# Patient Record
Sex: Female | Born: 1970 | Race: White | Hispanic: No | Marital: Married
Health system: Southern US, Community
[De-identification: ages and names within clinical notes are randomized; demographics above are authoritative.]

---

## 1998-04-01 ENCOUNTER — Other Ambulatory Visit: Admission: RE | Admit: 1998-04-01 | Discharge: 1998-04-01 | Payer: Self-pay | Admitting: Gynecology

## 1999-04-21 ENCOUNTER — Other Ambulatory Visit: Admission: RE | Admit: 1999-04-21 | Discharge: 1999-04-21 | Payer: Self-pay | Admitting: Gynecology

## 2000-03-22 ENCOUNTER — Other Ambulatory Visit: Admission: RE | Admit: 2000-03-22 | Discharge: 2000-03-22 | Payer: Self-pay | Admitting: Obstetrics and Gynecology

## 2000-10-06 ENCOUNTER — Encounter (INDEPENDENT_AMBULATORY_CARE_PROVIDER_SITE_OTHER): Payer: Self-pay

## 2000-10-06 ENCOUNTER — Inpatient Hospital Stay (HOSPITAL_COMMUNITY): Admission: AD | Admit: 2000-10-06 | Discharge: 2000-10-09 | Payer: Self-pay | Admitting: Obstetrics and Gynecology

## 2000-10-13 ENCOUNTER — Encounter: Admission: RE | Admit: 2000-10-13 | Discharge: 2001-01-11 | Payer: Self-pay | Admitting: Obstetrics and Gynecology

## 2000-11-04 ENCOUNTER — Other Ambulatory Visit: Admission: RE | Admit: 2000-11-04 | Discharge: 2000-11-04 | Payer: Self-pay | Admitting: Obstetrics and Gynecology

## 2001-09-07 ENCOUNTER — Ambulatory Visit (HOSPITAL_COMMUNITY): Admission: RE | Admit: 2001-09-07 | Discharge: 2001-09-07 | Payer: Self-pay | Admitting: Obstetrics and Gynecology

## 2001-12-02 ENCOUNTER — Inpatient Hospital Stay (HOSPITAL_COMMUNITY): Admission: AD | Admit: 2001-12-02 | Discharge: 2001-12-05 | Payer: Self-pay | Admitting: Obstetrics and Gynecology

## 2001-12-16 ENCOUNTER — Encounter: Admission: RE | Admit: 2001-12-16 | Discharge: 2002-01-15 | Payer: Self-pay | Admitting: Obstetrics and Gynecology

## 2002-01-11 ENCOUNTER — Other Ambulatory Visit: Admission: RE | Admit: 2002-01-11 | Discharge: 2002-01-11 | Payer: Self-pay | Admitting: Obstetrics and Gynecology

## 2003-02-23 ENCOUNTER — Other Ambulatory Visit: Admission: RE | Admit: 2003-02-23 | Discharge: 2003-02-23 | Payer: Self-pay | Admitting: Obstetrics and Gynecology

## 2003-05-22 ENCOUNTER — Other Ambulatory Visit: Admission: RE | Admit: 2003-05-22 | Discharge: 2003-05-22 | Payer: Self-pay | Admitting: Obstetrics and Gynecology

## 2003-10-24 ENCOUNTER — Encounter: Admission: RE | Admit: 2003-10-24 | Discharge: 2003-10-24 | Payer: Self-pay | Admitting: Obstetrics and Gynecology

## 2003-10-31 ENCOUNTER — Encounter: Admission: RE | Admit: 2003-10-31 | Discharge: 2003-10-31 | Payer: Self-pay | Admitting: Obstetrics and Gynecology

## 2003-11-06 ENCOUNTER — Encounter: Admission: RE | Admit: 2003-11-06 | Discharge: 2003-11-06 | Payer: Self-pay | Admitting: Obstetrics and Gynecology

## 2003-11-14 ENCOUNTER — Encounter: Admission: RE | Admit: 2003-11-14 | Discharge: 2003-11-14 | Payer: Self-pay | Admitting: Obstetrics and Gynecology

## 2003-11-17 ENCOUNTER — Inpatient Hospital Stay (HOSPITAL_COMMUNITY): Admission: AD | Admit: 2003-11-17 | Discharge: 2003-11-17 | Payer: Self-pay | Admitting: Obstetrics and Gynecology

## 2003-11-20 ENCOUNTER — Encounter: Admission: RE | Admit: 2003-11-20 | Discharge: 2003-11-20 | Payer: Self-pay | Admitting: Obstetrics and Gynecology

## 2003-11-23 ENCOUNTER — Encounter (INDEPENDENT_AMBULATORY_CARE_PROVIDER_SITE_OTHER): Payer: Self-pay | Admitting: Specialist

## 2003-11-23 ENCOUNTER — Inpatient Hospital Stay (HOSPITAL_COMMUNITY): Admission: AD | Admit: 2003-11-23 | Discharge: 2003-11-27 | Payer: Self-pay | Admitting: Obstetrics and Gynecology

## 2004-01-29 ENCOUNTER — Other Ambulatory Visit: Admission: RE | Admit: 2004-01-29 | Discharge: 2004-01-29 | Payer: Self-pay | Admitting: Obstetrics and Gynecology

## 2005-02-16 ENCOUNTER — Other Ambulatory Visit: Admission: RE | Admit: 2005-02-16 | Discharge: 2005-02-16 | Payer: Self-pay | Admitting: Obstetrics and Gynecology

## 2008-08-20 ENCOUNTER — Encounter: Admission: RE | Admit: 2008-08-20 | Discharge: 2008-08-20 | Payer: Self-pay | Admitting: Obstetrics and Gynecology

## 2011-02-27 NOTE — Discharge Summary (Signed)
NAME:  Jacqueline Hubbard, Jacqueline Hubbard                        ACCOUNT NO.:  [id number]   MEDICAL RECORD NO.:  [id number]                   PATIENT TYPE:  INP   LOCATION:  9132                                 FACILITY:  [facility]   PHYSICIAN:  Tracie Harrier, M.D.              DATE OF BIRTH:  Jan 17, 1971   DATE OF ADMISSION:  11/23/2003  DATE OF DISCHARGE:  11/27/2003                                 DISCHARGE SUMMARY   ADMISSION DIAGNOSES:  1. Intrauterine pregnancy at 36-5/7 weeks estimated gestational age.  2. Previous cesarean section, desires repeat.  3. Twin gestation.  4. Pregnancy induced hypertension.   DISCHARGE DIAGNOSES:  1. Status post low transverse cesarean section.  2. Viable twin female infants.   PROCEDURE:  Repeat low transverse cesarean section.   REASON FOR ADMISSION:  Please see written history and physical.   HISTORY OF PRESENT ILLNESS:  The patient was a 40 year old white married  female, gravida 3, para 2, that was admitted to [facility]  at 36-6/7 weeks estimated gestational age for cesarean section. The patient  had been followed closely in the office with noted increasing ability of her  blood pressure's. The patient was also noted to have proteinuria and also  had noted that CBC had revealed platelet count, which was declining. On the  morning of admission, blood pressure was 150/80's to 90's. Platelet's were  back into the 140's and other labs were within normal limits. The babies  were reactive. The patient was then transferred to the operating room where  spinal anesthesia was administered without difficulty. Low transverse  incision was made with the delivery of twin A female infant, weighing 8 pounds  9 ounces with Apgar's of 9 at 1 minute and 9 at 5 minutes. Umbilical cord pH  was 7.07. Baby B was delivered also a viable female infant weighing 6 pounds 6  ounces with Apgar's of 9 at 1 minute and 9 at 5 minutes. Umbilical cord pH  was 7.081. The patient  tolerated the procedure well and was taken to the  recovery room in stable condition. The patient was then transferred to the  AICU where magnesium sulfate was administered for 24 hours. On the following  day, the patient was feeling well. Blood pressure's 130's to 140's over 70  to 80's. Abdomen was soft with good return of bowel function. Fundus firm,  nontender. Labs revealed platelet count 119,000. LDH was 326. Otherwise  other labs were normal. On postoperative day 2, patient was without  complaints. Vital signs were stable. Blood pressure 120's to 130's over 60  to 70's. Urine output was increased. Magnesium sulfate was continuing.  Abdomen was soft. Dressing had been removed which revealed an incision that  was clean, dry and intact. Labs revealed hemoglobin of 8.1. AST was 42.  Magnesium sulfate was discontinued and the patient was later transferred to  the mother baby unit. On postoperative day  3, the patient was without  complaint. Vital signs were stable. She was afebrile. Blood pressures 120 to  140's over 70's to 89. Deep tendon reflexes were 1+. Abdomen was slightly  distended. Fundus was firm and non-tender. Incision was clean, dry, and  intact. She was ambulating well. Labs revealed hemoglobin of 7.1, platelet  count of 209,000. Liver function studies were within normal limits with the  exception of LDH, which was 304. On postoperative day 4, the patient was  without complaint. Vital signs were stable. Abdomen was soft. Fundus was  firm. Incision was clean, dry, and intact with slight drainage  noted at the  right margin of the incision. Ecchymosis was also noted. The patient was  ambulating well, tolerating a regular diet without complaints of nausea and  vomiting. Discharge teaching was reviewed and the patient was discharged  home.   CONDITION ON DISCHARGE:  Good.   DIET:  Regular as tolerated.   ACTIVITY:  No heavy lifting. No driving x2 weeks. No vaginal entry.    FOLLOW UP:  The patient to followup in the office in 2 days for an incision  check. She is to call for temperature greater than 100 degrees, persistent  nausea and vomiting, heavy vaginal bleeding, and/or redness or drainage from  the incisional site.   DISCHARGE MEDICATIONS:  1. Percocet 5/325 #30 1 p.o. every 4 to 6 hour p.r.n.  2. Motrin 600 mg every 6 hours.  3. Prenatal vitamins 1 p.o. daily.  4. Colace 1 p.o. daily p.r.n.     Julio Sicks, N.P.                        Tracie Harrier, M.D.    CC/MEDQ  D:  12/16/2003  T:  12/16/2003  Job:  [id number]

## 2011-02-27 NOTE — Discharge Summary (Signed)
[facility]  Patient:    Jacqueline Hubbard, Jacqueline Hubbard                       MRN: [id number] Adm. Date:  [id number] Disc. Date: [id number] Attending:  Trevor Iha Dictator:   Danie Chandler, R.N.                           Discharge Summary  ADMITTING DIAGNOSIS:          Intrauterine pregnancy at term in active labor.  DISCHARGE DIAGNOSES:          Intrauterine pregnancy at term in active labor, failure to descend, meconium stained fluid, chorioamnionitis.  PROCEDURE:                    On October 06, 2000 primary low transverse cesarean section.  REASON FOR ADMISSION:         The patient is a 40 year old married white female prima gravida who presented on the morning of December 26 with spontaneous onset of labor.  The temperature at that time was 100.3.  Later that morning artificial rupture of membranes was carried out revealing slightly meconium stained fluid.  Pitocin augmentation was begun.  She was begun on IV Cleocin due to maternal temperature.  She progressed to complete dilatation.  Despite adequate expulsive effort, the vertex remained at a 0 station despite 2 1/2 hours maternal effort.  The maternal temperature was up to 102.3 at that time.  Fetal heart rate was in the 180s with no decelerations.  The decision was made to proceed with primary cesarean section.  HOSPITAL COURSE:              The patient was taken to the operating room and underwent the above named procedure without complication.  This was productive of a viable female infant with Apgars of 8 at one minute and 9 at five minutes. Postoperatively on day #1 the patients hemoglobin was 12.1, hematocrit 34.3, and white blood cell count 18.1.  On postoperative day #2 the patient was without complaint.  She had a good return of bowel function and was tolerating a regular diet.  She was also ambulating well without difficulty, had good pain control.  She was discharged home on  postoperative day #3.  CONDITION ON DISCHARGE:       Good.  DIET:                         Regular, as tolerated.  ACTIVITY:                     No heavy lifting, no driving, no vaginal entry.  FOLLOW-UP:                    She is to follow-up in the office in one to two weeks for incision check.  She is to call for temperature greater than 100 degrees, persistent nausea or vomiting, heavy vaginal bleeding, and/or redness or drainage from the incision site.  DISCHARGE MEDICATIONS:        1. Percocet #30 one to two p.o. q.4-6h. p.r.n.                                  pain.  2. Prenatal vitamin one p.o. q.d. DD:  11/02/00 TD:  11/02/00 Job: [id number]

## 2011-02-27 NOTE — H&P (Signed)
[facility]  Patient:    Jacqueline Hubbard, Jacqueline Hubbard Visit Number: [id number] MRN: [id number]          Service Type: Attending:  Duke Salvia. Marcelle Overlie, M.D. Dictated by:   Duke Salvia. Marcelle Overlie, M.D. Adm. Date:  12/02/01                           History and Physical  CHIEF COMPLAINT:              For repeat cesarean section.  HISTORY OF PRESENT ILLNESS:   This 40 year old, G2, P1-0-0-1, underwent LTCS in December of 2001 for CPD and chorioamnionitis.  She presents now for repeat cesarean section.  She has declined VBAC.  This procedure, including the risks of bleeding, infection, transfusion, adjacent organ injury, and expected recovery time, were all reviewed with her.  Her blood type was AB-.  Rubella titer positive.  A one-hour GTT was 81.  The group B streptococcus screen was negative.  ALLERGIES:                    PENICILLIN.  PAST SURGICAL HISTORY:        Cesarean section in December of 2001.  REVIEW OF SYSTEMS:            Otherwise unremarkable.  PHYSICAL EXAMINATION:         Temperature 98.2 degrees, blood pressure 110/70. HEENT:                        Unremarkable.  NECK:                         Supple without mass.  LUNGS:                        Clear.  CARDIOVASCULAR:               Regular rate and rhythm without murmurs, rubs, or gallops.  BREASTS:                      Not examined.  ABDOMEN:                      Term fundal height.  Fetal heart rate 140.  PELVIC:                       The cervix was closed.  EXTREMITIES:                  Unremarkable.  NEUROLOGIC:                   Unremarkable.  IMPRESSION:                   Term intrauterine pregnancy.  The estimated date of delivery is December 06, 2001, by ultrasound.  PLAN:                         Repeat cesarean section.  The procedure and risks were discussed as above. Dictated by:   Duke Salvia. Marcelle Overlie, M.D. Attending:  Duke Salvia. Marcelle Overlie, M.D. DD:  11/29/01 TD:   11/29/01 Job: [id number]

## 2011-02-27 NOTE — Op Note (Signed)
NAME:  Jacqueline Hubbard, Jacqueline Hubbard                        ACCOUNT NO.:  [id number]   MEDICAL RECORD NO.:  [id number]                   PATIENT TYPE:  INP   LOCATION:  9372                                 FACILITY:  [facility]   PHYSICIAN:  Guy Sandifer. Arleta Creek, M.D.           DATE OF BIRTH:  July 20, 1971   DATE OF PROCEDURE:  11/23/2003  DATE OF DISCHARGE:                                 OPERATIVE REPORT   PREOPERATIVE DIAGNOSES:  1. Intrauterine pregnancy at 36-6/7 weeks estimated gestational age.  2. Twins.  3. Previous cesarean section, desires repeat.  4. Pregnancy-induced hypertension.   POSTOPERATIVE DIAGNOSES:  1. Intrauterine pregnancy at 36-6/17 weeks estimated gestational age.  2. Twins.  3. Previous cesarean section.  4. Pregnancy-induced hypertension.   PROCEDURE:  Repeat low transverse cesarean section.   SURGEON:  Guy Sandifer. Henderson Cloud, M.D.   ANESTHESIA:  General.   ANESTHESIOLOGIST:  Leilani Able, M.D.   ESTIMATED BLOOD LOSS:  800 cc.   SPECIMENS:  Placenta to pathology.   FINDINGS:  1. Baby A -- Viable female infant.  Apgars of 9 and 9 at one and five minutes     respectively.  8 pounds 9 ounces.  Arterial cord pH 7.07.  2. Baby B -- Viable female infant.  Apgars of 9 and 9 at one and five minutes     respectively.  6 pounds 6 ounces.  Arterial cord pH 7.081.   INDICATIONS AND CONSENT:  The patient is a 40 year old married white female  (G3, P2) with an EDC of December 16, 2003, who has been followed for increasing  lability of her blood pressures.  She has also had 1 and 2+ protein in the  urine.  A 24-hr urine approximately 1-1/2 weeks ago had  150 mg total protein.  Repeat 24-hr urine turned in yesterday date obtained  today, revealed total protein of approximately 330 mg of protein.  CBC of  yesterday also had a platelet count of 113,000, whereas it was 140,000  previously.  The patient was reevaluated today in triage, and her blood  pressures were 150/80-90.  Platelets were  back up to 140s and other labs  were within normal limits.  Babies were reactive.   After discussion of the options with the patient, the decision was made to  proceed to cesarean section.  Potential risks and complications were  discussed with the patient preoperatively.   DESCRIPTION OF PROCEDURE:  The patient was taken to the operating room,  where a spinal anesthetic is placed.  She is placed in the dorsal supine  position with a 15-degree left lateral wedge.  The abdomen is taped upward  on the bar to elevate the panniculus.  She is prepped, bladder is  catheterized with a Foley catheter, and she is draped in a sterile fashion.  After testing for adequate spinal anesthesia, skin was entered around the  previous Pfannenstiel scar, with the scar being taken out  of the way.   Dissection is carried out in layers to the peritoneum, which is incised and  extended superiorly and inferiorly.  The vesicouterine peritoneum is taken  down cephalolaterally.  The bladder flap is developed and the bladder blade  is placed.  The uterus is incised in a low transverse manner, and the  uterine cavity is entered bluntly with a hemostat.  Clear fluid is  encountered.  The incision is extended cephalolaterally with the fingers.   The first infant encountered is in the frank breech presentation, and is  delivered without difficulty.  There is a nuchal cord x1.  Good cry and tone  is noted.  Oral and nasopharynx are suctioned and the cord is clamped and  cut.  The baby is handed to awaiting pediatrics team.   Artifical rupture of membrane on the second baby is carried out for clear  fluid as well.  This baby is vertex.  It is delivered without difficulty.  There is also a nuchal cord x1 noted.  Good cry and tone are again noted  upon delivery.  The oral and nasopharynx suctioned as well.  This baby is  also handed to the pediatric team.  The umbilical cord for Baby B is marked  with a plastic cord  clamp.  Placenta is manually delivered and sent to  pathology.   The uterus has a smooth internal contour and is clean.  It is closed in a  running interlocking fashion with 0 Monocryl suture, which is used for good  hemostasis.  Tubes and ovaries are normal bilaterally.  The anterior  peritoneum is closed in running fashion with  0 Monocryl suture, which is also used reapproximate the pyramidalis muscle  in the midline.  The rectus fascia is closed in running fashion with 0 PDS  suture.  The skin is closed with clips.   All sponge, needle and instrument counts are correct.  The patient is taken  to the recovery room in stable condition.                                               Guy Sandifer Arleta Creek, M.D.    JET/MEDQ  D:  11/23/2003  T:  11/24/2003  Job:  [id number]

## 2011-02-27 NOTE — Discharge Summary (Signed)
[facility]  Patient:    Jacqueline Hubbard, Jacqueline Hubbard Visit Number: [id number] MRN: [id number]          Service Type: OBS Location: 910A 9117 01 Attending Physician:  Rhina Brackett Dictated by:   Danie Chandler, R.N. Admit Date:  12/02/2001 Discharge Date: 12/05/2001                             Discharge Summary  ADMITTING DIAGNOSES: 1. Intrauterine pregnancy at term. 2. Previous cesarean section, declines vaginal birth after cesarean section.  DISCHARGE DIAGNOSES: 1. Intrauterine pregnancy at term. 2. Previous cesarean section, declines vaginal birth after cesarean section.  PROCEDURE:                    On December 02, 2001, repeat low transverse cesarean section.  REASON FOR ADMISSION:         Please see dictated H&P.  HOSPITAL COURSE:              The patient was taken to the operating room and underwent the above-named procedure without complication.  This was productive of a viable female infant with Apgars of 9 at one minute and 9 at five minutes.  Postoperatively on day #1, the patient was without complaint, vital signs were stable, her hemoglobin was 9.0, hematocrit 27.0 and white blood cell count 14.7.  On postoperative day #2, the patient had good return of bowel function and was tolerating a regular; she was also ambulating well without difficulty and had good pain control.  She was discharged home on postoperative day #3.  CONDITION ON DISCHARGE:       Good.  DIET:                         Regular as tolerated.  ACTIVITY:                     No heavy lifting.  No driving.  No vaginal ______.  FOLLOWUP:                     She is to follow up in the office in one to two weeks for incision check and she is to call for temperature greater than 100 degrees, persistent nausea or vomiting, heavy vaginal bleeding and/or redness or drainage from the incision site.  DISCHARGE MEDICATIONS: 1. Prenatal vitamin one p.o. q.d. 2.  Percocet one every four hours as needed for pain. Dictated by:   Danie Chandler, R.N. Attending Physician:  Rhina Brackett DD:  12/21/01 TD:  12/23/01 Job: [id number]

## 2011-02-27 NOTE — Op Note (Signed)
[facility]  Patient:    Jacqueline Hubbard, Jacqueline Hubbard Visit Number: [id number] MRN: [id number]          Service Type: OBS Location: 910A 9117 01 Attending Physician:  Rhina Brackett Dictated by:   Duke Salvia. Marcelle Overlie, M.D. Proc. Date: 12/02/01 Admit Date:  12/02/2001                             Operative Report  PREOPERATIVE DIAGNOSIS:       Term intrauterine pregnancy, previous cesarean section, declines vaginal birth after cesarean section.  POSTOPERATIVE DIAGNOSIS:      Term intrauterine pregnancy, previous cesarean section, declines vaginal birth after cesarean section.  OPERATION:                    Repeat low transverse cesarean section.  SURGEON:                      Duke Salvia. Marcelle Overlie, M.D.  ASSISTANT:                    Marcelle Overlie, M.D.  ANESTHESIA:                   Spinal.  COMPLICATIONS:                None.  DRAINS:                       Foley catheter.  ESTIMATED BLOOD LOSS:         800.  DESCRIPTION OF PROCEDURE:     The patient was taken to the operating room. After an adequate level of spinal anesthetic was obtained, with the patient in the left lateral position, the abdomen was prepped and draped in the usual manner for sterile abdominal procedures.  Foley catheter was positioned draining clear urine.  The old scar was excised in an ellipse.  A fair amount of keloid formation had been noted.  This was carried down to the fascia which was incised and extended transversely.  The rectus muscle was divided in the midline.  Peritoneum entered superiorly without incident and extended in a vertical manner.  Vesicouterine serosa was then incised and the bladder was bluntly and sharply dissected off the lower uterine segment.  Bladder blade was positioned.  Transverse incision was made in the lower segment, extended with bandage scissors.  Clear fluid noted.  The patient was delivered of a healthy female, Apgars 9 and 9.  The  infant was suctioned, cord clamped and passed to pediatric team for further care.  Placenta was delivered manually intact.  Uterus exteriorized, cavity wiped clean with laparotomy pack. Closure was obtained with first layer of 0 chromic in a locked fashion followed by an imbricating layer of 0 chromic which was hemostatic.  Tubes and ovaries were normal.  Bladder flap area was intact and hemostatic.  Prior to closure, sponge, needle and instrument counts were reported as correct x 2. Rectus muscles reapproximated with a 2-0 Dexon interrupted suture.  Fascia closed from laterally to the midline on either side with 0 PDS suture.  The subcutaneous fat was hemostatic.  Prior to closure, subdermal betamethasone solution, 6 mg in 20 cc, was injected to try to prevent keloids formation. Clips and Steri-Strips were used on the skin.  Pressure dressing was applied. Clear urine noted at the end of the case.  She tolerated this well and went to the recovery room in good condition.  Pitocin IV and Cefotan 1 g IV was given after the cord was clamped. Dictated by:   Duke Salvia. Marcelle Overlie, M.D. Attending Physician:  Rhina Brackett DD:  12/02/01 TD:  12/03/01 Job: [id number]

## 2011-02-27 NOTE — Op Note (Signed)
[facility]  Patient:    Jacqueline Hubbard, Jacqueline Hubbard                       MRN: [id number] Proc. Date: 10/06/00 Adm. Date:  [id number] Attending:  Trevor Iha                           Operative Report  PREOPERATIVE DIAGNOSES:       1. Intrauterine pregnancy at term with failure                                  to descend.                               2. Meconium stained fluid.                               3. Chorioamnionitis.  POSTOPERATIVE DIAGNOSES:      1. Intrauterine pregnancy at term with failure                                  to descend.                               2. Meconium stained fluid.                               3. Chorioamnionitis.  PROCEDURE:                    Low transverse cesarean section.  SURGEON:                      Juluis Mire, M.D.  ANESTHESIA:                   Epidural.  ESTIMATED BLOOD LOSS:         800 cc.  PACKS AND DRAINS:             None.  INTRAOPERATIVE BLOOD REPLACEMENT:            None.  COMPLICATIONS:                None.  INDICATIONS:                  A 40 year old primigravida married white female who presented on the morning of December 26 with spontaneous onset of labor. Temperature at that time was 100.3.  Later that same morning artificial rupture of membranes revealed slightly meconium stained fluid.  Pitocin augmentation was begun.  She was begun on IV Cleocin due to maternal temperature.  Her group B Strep in the office had been negative.  She progressed to complete dilatation.  Despite adequate expulsive effort, the vertex remained at a 0 station despite 2-1/2 hours of maternal effort.  The maternal temperature was up to 102.3 at that time.  Fetal heart rate was in the 180s with no decelerations.  The decision was made to proceed with primary cesarean section.  The risks of surgery were discussed including the risks of anesthesia;  the risk of infection; the risk of hemorrhage with the  risk of transfusion and the risks of hepatitis; the risk of injury to adjacent organs including bladder, bowel and ureters that could require further exploratory surgery; the risks of deep venous thrombosis and pulmonary embolus.  The patient expressed an understanding of the indications and risks.  DESCRIPTION OF PROCEDURE:     The patient was taken to the operating room and placed in the supine position with left lateral tilt.  After a satisfactory level of epidural anesthesia was obtained, the abdomen was prepped out with Betadine and draped as a sterile field.  A low transverse skin incision was made in abdomen and carried through the subcutaneous tissue.  The anterior rectus fascia was entered sharply.  The incision in the fascia was extended laterally.  The fascia was taken off of the muscles superior and inferiorly using both blunt and sharp dissection.  The rectus muscles were then separated in the midline.  The peritoneum was entered sharply.  The incision in the peritoneum was extended both superiorly and inferiorly.  A low transverse bladder flap was developed.  A low transverse uterine incision was begun with the knife and extended laterally using manual retraction.  The infant presented in the vertex presentation.  There were two nuchal cords.  The infant was delivered with only subtle fundal pressure.  Of note, there was meconium stained fluid.  The oropharynx and nasopharynx were suctioned using DeLee and bulb.  The infant was then passed off to the waiting pediatric team. the infant was a viable female who weighed 8 pounds 8 ounces.  Apgars were 8/9. Umbilical artery pH was 7.28.  The placenta was then delivered manually.  It was meconium stained and was sent for pathologic review.  The uterus was wiped free of remaining membranes and placenta.  The uterus was closed with interlocking sutures of 0 chromic using a two layer closure technique.  There was good hemostasis at this  point.  The tubes and ovaries were visualized and were noted to be unremarkable.  Of note, her urine was blood tinged on arrival to the operating room and remained so.  There was no change.  Evaluation of the bladder revealed no evidence of any bladder injury.  The peritoneum was closed with a running suture of 3-0 Vicryl.  The fascia was closed with a running suture of 0 ______.  The skin was closed with staples and Steri-Strips.  Sponge, needle and instrument counts were reported as correct by the circulating nurse x 2.  Urine was clear in the Foley catheter.  The patient tolerated the procedure well and was returned to the recovery room in good condition. DD:  10/06/00 TD:  10/06/00 Job: [id number]

## 2011-09-14 ENCOUNTER — Other Ambulatory Visit: Payer: Self-pay | Admitting: Obstetrics and Gynecology

## 2011-09-14 DIAGNOSIS — R928 Other abnormal and inconclusive findings on diagnostic imaging of breast: Secondary | ICD-10-CM

## 2011-09-28 ENCOUNTER — Ambulatory Visit
Admission: RE | Admit: 2011-09-28 | Discharge: 2011-09-28 | Disposition: A | Discharge status: Home / Self Care | Payer: BC Managed Care – PPO | Source: Ambulatory Visit | Attending: Obstetrics and Gynecology | Admitting: Obstetrics and Gynecology

## 2011-09-28 DIAGNOSIS — R928 Other abnormal and inconclusive findings on diagnostic imaging of breast: Secondary | ICD-10-CM

## 2011-12-28 ENCOUNTER — Other Ambulatory Visit: Payer: Self-pay | Admitting: Obstetrics and Gynecology

## 2011-12-28 DIAGNOSIS — R921 Mammographic calcification found on diagnostic imaging of breast: Secondary | ICD-10-CM

## 2012-03-16 ENCOUNTER — Ambulatory Visit
Admission: RE | Admit: 2012-03-16 | Discharge: 2012-03-16 | Disposition: A | Discharge status: Home / Self Care | Payer: BC Managed Care – PPO | Source: Ambulatory Visit | Attending: Obstetrics and Gynecology | Admitting: Obstetrics and Gynecology

## 2012-03-16 DIAGNOSIS — R921 Mammographic calcification found on diagnostic imaging of breast: Secondary | ICD-10-CM

## 2012-11-21 ENCOUNTER — Other Ambulatory Visit: Payer: Self-pay | Admitting: Obstetrics and Gynecology

## 2012-11-21 DIAGNOSIS — R928 Other abnormal and inconclusive findings on diagnostic imaging of breast: Secondary | ICD-10-CM

## 2012-12-02 ENCOUNTER — Ambulatory Visit
Admission: RE | Admit: 2012-12-02 | Discharge: 2012-12-02 | Disposition: A | Discharge status: Home / Self Care | Payer: BC Managed Care – PPO | Source: Ambulatory Visit | Attending: Obstetrics and Gynecology | Admitting: Obstetrics and Gynecology

## 2012-12-02 DIAGNOSIS — R928 Other abnormal and inconclusive findings on diagnostic imaging of breast: Secondary | ICD-10-CM

## 2014-01-02 ENCOUNTER — Other Ambulatory Visit: Payer: Self-pay | Admitting: Obstetrics and Gynecology

## 2014-01-09 ENCOUNTER — Other Ambulatory Visit: Payer: Self-pay | Admitting: Obstetrics and Gynecology

## 2014-01-09 DIAGNOSIS — R928 Other abnormal and inconclusive findings on diagnostic imaging of breast: Secondary | ICD-10-CM

## 2014-03-19 ENCOUNTER — Other Ambulatory Visit: Payer: BC Managed Care – PPO

## 2014-03-26 ENCOUNTER — Ambulatory Visit
Admission: RE | Admit: 2014-03-26 | Discharge: 2014-03-26 | Disposition: A | Discharge status: Home / Self Care | Payer: BC Managed Care – PPO | Source: Ambulatory Visit | Attending: Obstetrics and Gynecology | Admitting: Obstetrics and Gynecology

## 2014-03-26 ENCOUNTER — Other Ambulatory Visit: Payer: Self-pay | Admitting: Obstetrics and Gynecology

## 2014-03-26 DIAGNOSIS — R928 Other abnormal and inconclusive findings on diagnostic imaging of breast: Secondary | ICD-10-CM

## 2014-10-29 IMAGING — MG MM DIAG BREAST TOMO UNI LEFT
6 series · 6 of 14 positions shown · non-contrast
Comparison: 01/02/2014, 12/02/2012, 11/15/2012, with 08/20/2008.

CLINICAL DATA: Recall from screening mammogram.

EXAM:
DIGITAL DIAGNOSTIC LEFT MAMMOGRAM WITH TOMOSYNTHESIS AND CAD AND
ULTRASOUND
DIGITAL BREAST TOMOSYNTHESIS
Digital breast tomosynthesis images are acquired in two projections.
These images are reviewed in combination with the digital mammogram,
confirming the findings below.

[L CC]
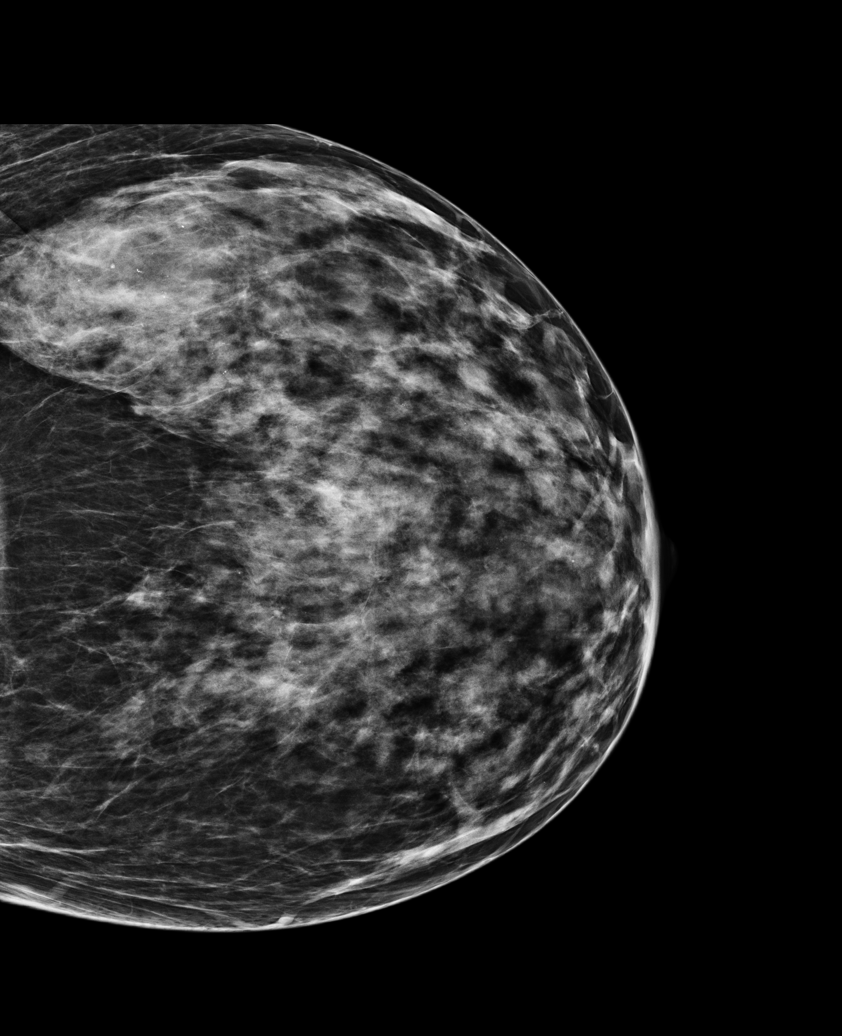

[L MLO]
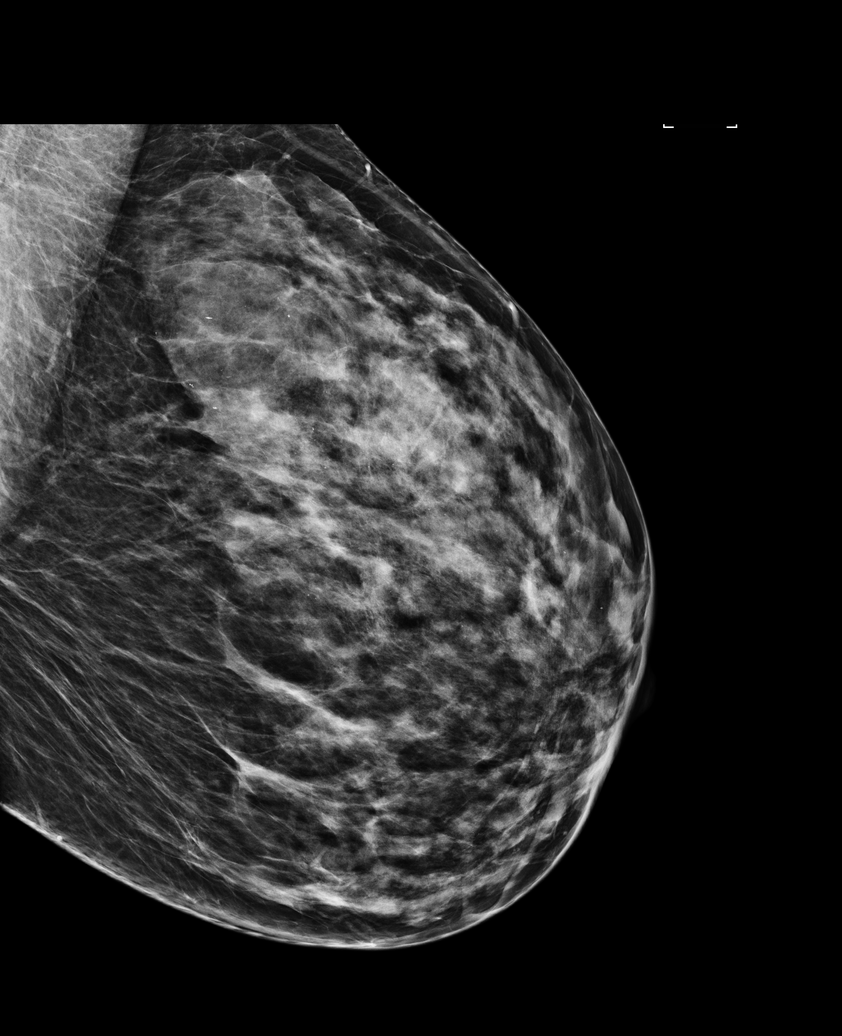

[L CC synth-2D]
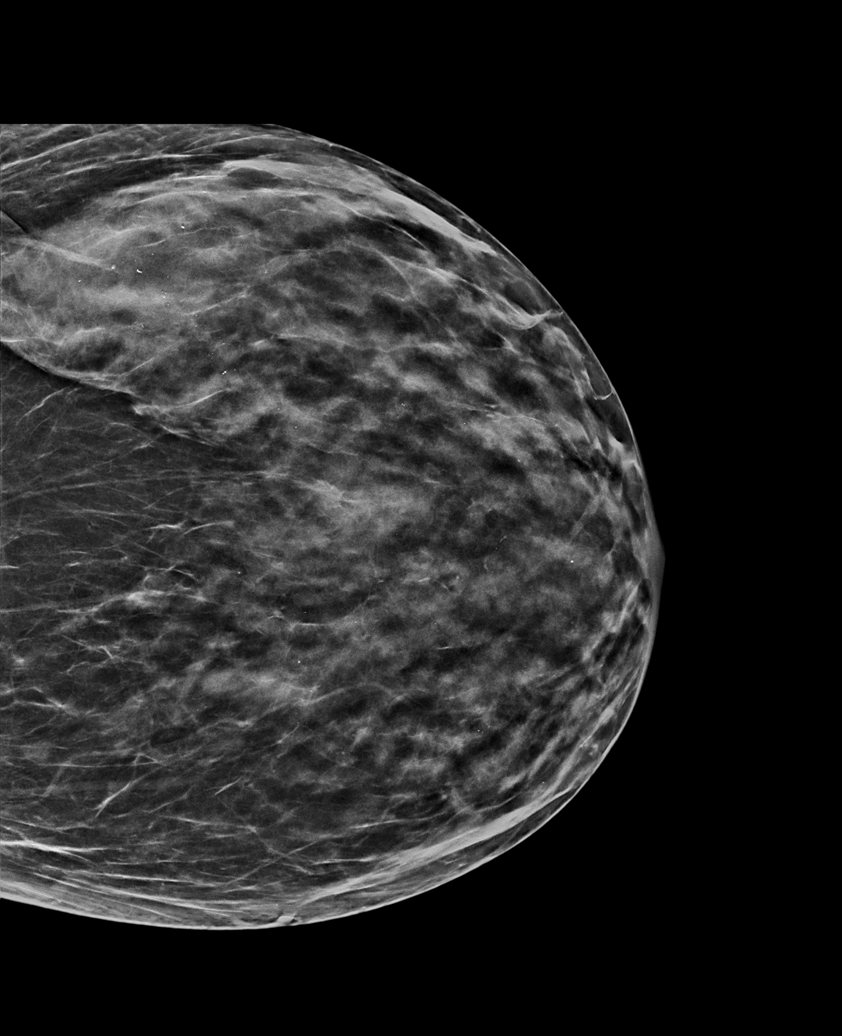

[L MLO synth-2D]
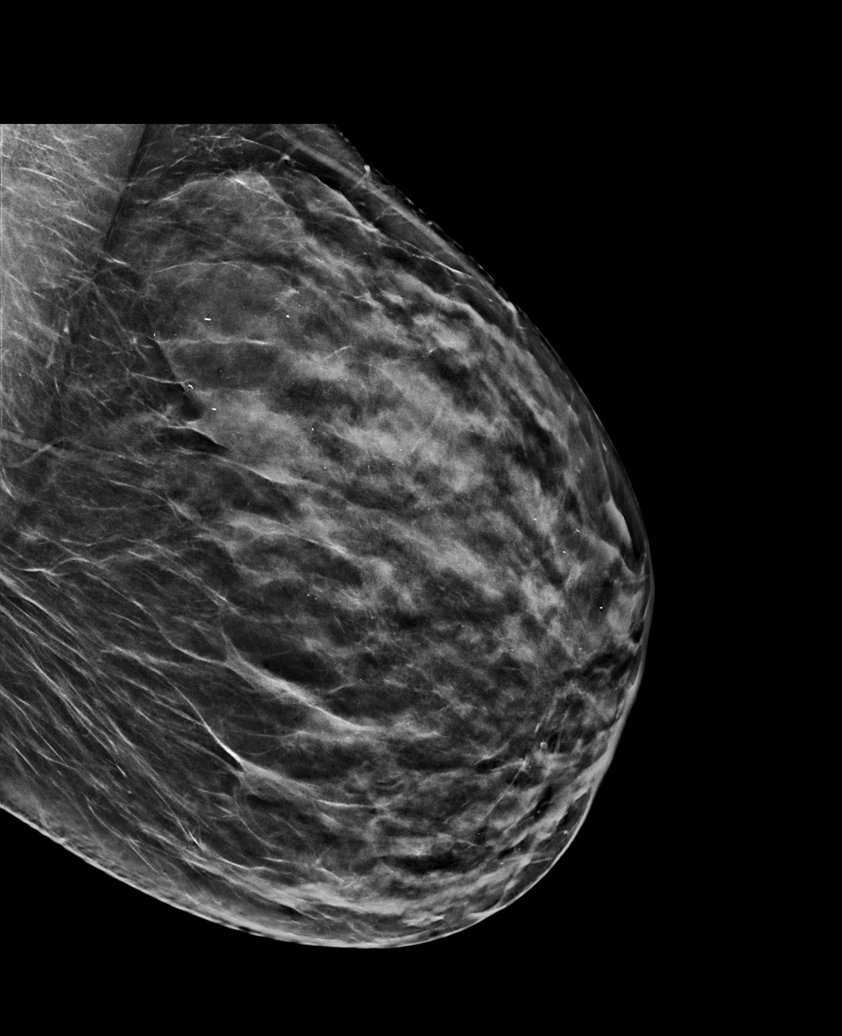

[L MLO tomo · tomo slice 35/68.0]
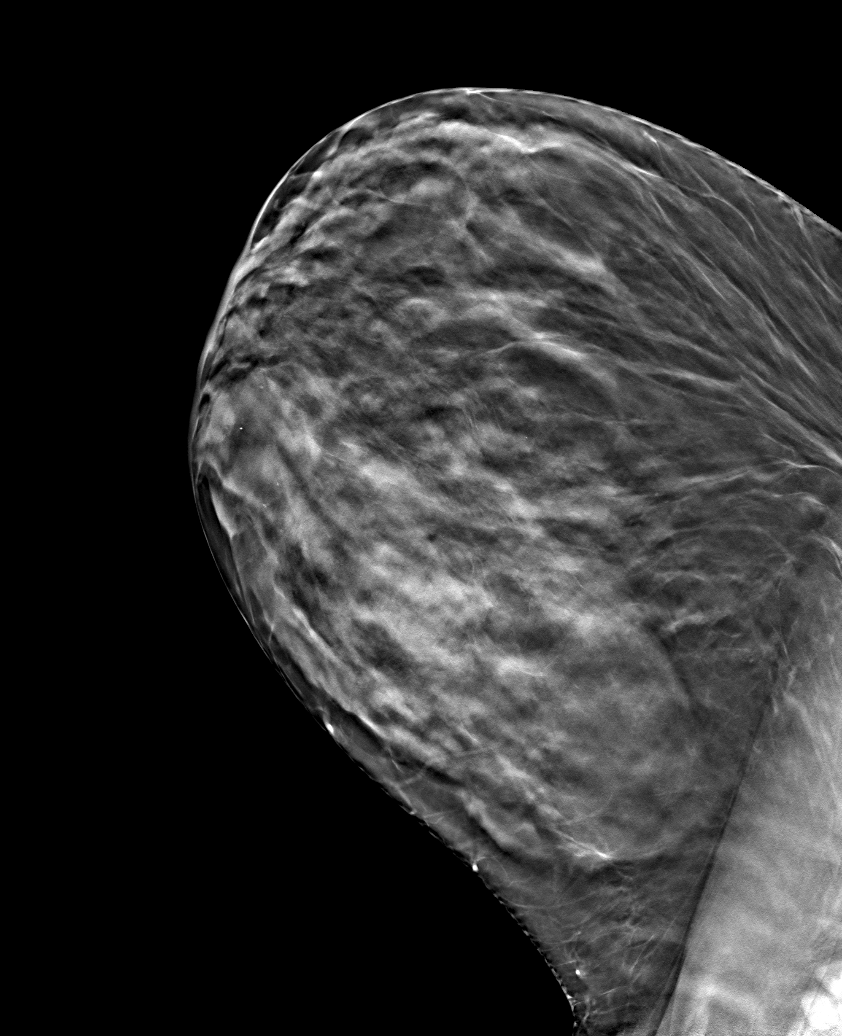

[L CC tomo · tomo slice 33/64.0]
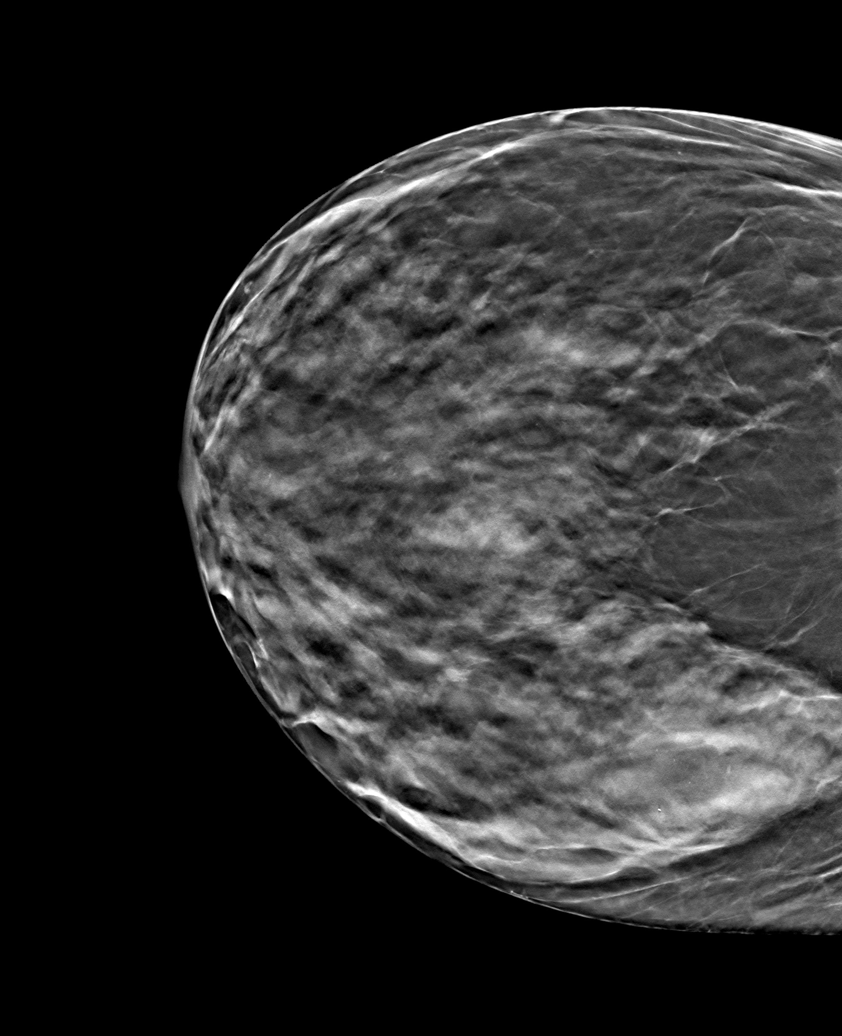

[6 of 14 positions shown; findings below may reference images not displayed]

ACR Breast Density Category c: The breast tissue is heterogeneously
dense, which may obscure small masses.
FINDINGS: Additional spot compression views of the left breast and
tomosynthesis views of the left breast demonstrate no persistent
distortion.

On physical exam, there is no discrete palpable abnormality within
the central or superior left breast.

Ultrasound is performed, showing normal appearing fibroglandular
tissue. There is no mass, distortion, or worrisome shadowing.

Mammographic images were processed with CAD.
IMPRESSION: No persistent abnormality on additional evaluation of the left
breast. The appearance noted on the screening study this most
consistent with a summation shadow.

RECOMMENDATION:
Bilateral screening mammography 1 year.

I have discussed the findings and recommendations with the patient.
Results were also provided in writing at the conclusion of the
visit. If applicable, a reminder letter will be sent to the patient
regarding the next appointment.

BI-RADS CATEGORY  1: Negative.

## 2019-06-15 ENCOUNTER — Other Ambulatory Visit: Payer: Self-pay | Admitting: Obstetrics and Gynecology

## 2019-06-15 DIAGNOSIS — R928 Other abnormal and inconclusive findings on diagnostic imaging of breast: Secondary | ICD-10-CM

## 2019-06-21 ENCOUNTER — Ambulatory Visit: Payer: Self-pay

## 2019-06-21 ENCOUNTER — Other Ambulatory Visit: Payer: Self-pay

## 2019-06-21 ENCOUNTER — Ambulatory Visit
Admission: RE | Admit: 2019-06-21 | Discharge: 2019-06-21 | Disposition: A | Discharge status: Home / Self Care | Payer: 59 | Source: Ambulatory Visit | Attending: Obstetrics and Gynecology | Admitting: Obstetrics and Gynecology

## 2019-06-21 DIAGNOSIS — R928 Other abnormal and inconclusive findings on diagnostic imaging of breast: Secondary | ICD-10-CM

## 2024-04-24 ENCOUNTER — Ambulatory Visit (INDEPENDENT_AMBULATORY_CARE_PROVIDER_SITE_OTHER): Payer: Self-pay | Admitting: Podiatry

## 2024-04-24 ENCOUNTER — Encounter: Payer: Self-pay | Admitting: Podiatry

## 2024-04-24 DIAGNOSIS — B351 Tinea unguium: Secondary | ICD-10-CM

## 2024-04-24 DIAGNOSIS — S9031XA Contusion of right foot, initial encounter: Secondary | ICD-10-CM | POA: Diagnosis not present

## 2024-04-26 NOTE — Progress Notes (Signed)
 Subjective:   Patient ID: Jacqueline Hubbard, female   DOB: 53 y.o.   MRN: [id number]   HPI Patient presents with discoloration and history of trauma to the toenails hallux bilaterally right over left and states that they do get tender at times but it is more the thickness and the way that they grow.  Patient does not smoke likes to be active   Review of Systems  All other systems reviewed and are negative.       Objective:  Physical Exam Vitals and nursing note reviewed.  Constitutional:      Appearance: She is well-developed.  Pulmonary:     Effort: Pulmonary effort is normal.  Musculoskeletal:        General: Normal range of motion.  Skin:    General: Skin is warm.  Neurological:     Mental Status: She is alert.     Neurovascular status intact muscle strength was found to be adequate range of motion adequate with the patient noted to have discoloration of nailbeds hallux right over left with distal thickness of two thirds of the bed with moderate looseness of the bed especially at different times.  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  Patient agrees with this and we are going to just hold off on any active treatment currently with all questions answered

## 2024-05-03 NOTE — Telephone Encounter (Signed)
 Please come to office and pick up formula 7

## 2024-05-04 NOTE — Telephone Encounter (Signed)
 Left message to purchase the formula 7 at front desk for treatment.
